# Patient Record
Sex: Male | Born: 1980 | Race: White | Hispanic: No | Marital: Married | Smoking: Current every day smoker
Health system: Southern US, Community
[De-identification: ages and names within clinical notes are randomized; demographics above are authoritative.]

## PROBLEM LIST (undated history)

## (undated) DIAGNOSIS — M25569 Pain in unspecified knee: Secondary | ICD-10-CM

## (undated) DIAGNOSIS — F988 Other specified behavioral and emotional disorders with onset usually occurring in childhood and adolescence: Secondary | ICD-10-CM

## (undated) DIAGNOSIS — G8929 Other chronic pain: Secondary | ICD-10-CM

## (undated) DIAGNOSIS — F419 Anxiety disorder, unspecified: Secondary | ICD-10-CM

## (undated) HISTORY — DX: Other specified behavioral and emotional disorders with onset usually occurring in childhood and adolescence: F98.8

## (undated) HISTORY — PX: HERNIA REPAIR: SHX51

## (undated) HISTORY — DX: Anxiety disorder, unspecified: F41.9

## (undated) HISTORY — DX: Pain in unspecified knee: M25.569

## (undated) HISTORY — DX: Other chronic pain: G89.29

---
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
## 2004-03-16 ENCOUNTER — Ambulatory Visit: Payer: Self-pay | Admitting: Internal Medicine

## 2004-06-30 ENCOUNTER — Ambulatory Visit: Payer: Self-pay | Admitting: Internal Medicine

## 2004-10-26 ENCOUNTER — Ambulatory Visit: Payer: Self-pay | Admitting: Internal Medicine

## 2004-11-29 ENCOUNTER — Ambulatory Visit: Payer: Self-pay | Admitting: Internal Medicine

## 2005-04-04 ENCOUNTER — Ambulatory Visit: Payer: Self-pay | Admitting: Internal Medicine

## 2005-04-13 ENCOUNTER — Ambulatory Visit: Payer: Self-pay | Admitting: Internal Medicine

## 2005-07-24 ENCOUNTER — Ambulatory Visit: Payer: Self-pay | Admitting: Internal Medicine

## 2005-08-08 ENCOUNTER — Ambulatory Visit: Payer: Self-pay | Admitting: Internal Medicine

## 2005-08-09 ENCOUNTER — Ambulatory Visit: Payer: Self-pay | Admitting: *Deleted

## 2005-08-21 ENCOUNTER — Ambulatory Visit: Payer: Self-pay | Admitting: Gastroenterology

## 2005-08-25 ENCOUNTER — Ambulatory Visit: Payer: Self-pay | Admitting: Gastroenterology

## 2005-08-25 ENCOUNTER — Encounter (INDEPENDENT_AMBULATORY_CARE_PROVIDER_SITE_OTHER): Payer: Self-pay | Admitting: Specialist

## 2005-09-20 ENCOUNTER — Ambulatory Visit: Payer: Self-pay | Admitting: Internal Medicine

## 2005-12-11 ENCOUNTER — Ambulatory Visit: Payer: Self-pay | Admitting: Gastroenterology

## 2006-01-16 ENCOUNTER — Ambulatory Visit: Payer: Self-pay | Admitting: Internal Medicine

## 2006-08-08 ENCOUNTER — Ambulatory Visit: Payer: Self-pay | Admitting: Internal Medicine

## 2006-08-08 ENCOUNTER — Ambulatory Visit (HOSPITAL_COMMUNITY): Admission: RE | Admit: 2006-08-08 | Discharge: 2006-08-08 | Payer: Self-pay | Admitting: Internal Medicine

## 2006-09-18 ENCOUNTER — Ambulatory Visit: Payer: Self-pay | Admitting: Internal Medicine

## 2006-11-15 ENCOUNTER — Ambulatory Visit: Payer: Self-pay | Admitting: Internal Medicine

## 2006-11-17 DIAGNOSIS — F988 Other specified behavioral and emotional disorders with onset usually occurring in childhood and adolescence: Secondary | ICD-10-CM | POA: Insufficient documentation

## 2007-01-17 DIAGNOSIS — F911 Conduct disorder, childhood-onset type: Secondary | ICD-10-CM | POA: Insufficient documentation

## 2007-02-05 ENCOUNTER — Ambulatory Visit: Payer: Self-pay | Admitting: Internal Medicine

## 2007-02-05 ENCOUNTER — Encounter: Payer: Self-pay | Admitting: Internal Medicine

## 2007-02-05 DIAGNOSIS — F411 Generalized anxiety disorder: Secondary | ICD-10-CM | POA: Insufficient documentation

## 2007-02-05 LAB — CONVERTED CEMR LAB
Bacteria, UA: NEGATIVE
Bilirubin Urine: NEGATIVE
Crystals: NEGATIVE
Hemoglobin, Urine: NEGATIVE
Ketones, ur: NEGATIVE mg/dL
Leukocytes, UA: NEGATIVE
Mucus, UA: NEGATIVE
Nitrite: NEGATIVE
RBC / HPF: NONE SEEN
Specific Gravity, Urine: 1.01 (ref 1.000–1.03)
Total Protein, Urine: NEGATIVE mg/dL
Urine Glucose: NEGATIVE mg/dL
Urobilinogen, UA: 0.2 (ref 0.0–1.0)
WBC, UA: NONE SEEN cells/hpf
pH: 6 (ref 5.0–8.0)

## 2007-02-27 ENCOUNTER — Telehealth: Payer: Self-pay | Admitting: Internal Medicine

## 2007-05-08 ENCOUNTER — Ambulatory Visit: Payer: Self-pay | Admitting: Internal Medicine

## 2007-05-08 DIAGNOSIS — K5289 Other specified noninfective gastroenteritis and colitis: Secondary | ICD-10-CM | POA: Insufficient documentation

## 2007-05-10 ENCOUNTER — Telehealth: Payer: Self-pay | Admitting: Internal Medicine

## 2007-08-01 ENCOUNTER — Ambulatory Visit: Payer: Self-pay | Admitting: Internal Medicine

## 2007-08-01 DIAGNOSIS — M25569 Pain in unspecified knee: Secondary | ICD-10-CM

## 2007-08-13 ENCOUNTER — Encounter: Payer: Self-pay | Admitting: Internal Medicine

## 2008-04-28 ENCOUNTER — Ambulatory Visit: Payer: Self-pay | Admitting: Internal Medicine

## 2008-06-10 ENCOUNTER — Ambulatory Visit: Payer: Self-pay | Admitting: Endocrinology

## 2008-06-10 DIAGNOSIS — R42 Dizziness and giddiness: Secondary | ICD-10-CM

## 2008-06-10 LAB — CONVERTED CEMR LAB
BUN: 12 mg/dL (ref 6–23)
Basophils Absolute: 0 10*3/uL (ref 0.0–0.1)
Basophils Relative: 0.4 % (ref 0.0–3.0)
CO2: 29 meq/L (ref 19–32)
Calcium: 9.1 mg/dL (ref 8.4–10.5)
Chloride: 105 meq/L (ref 96–112)
Creatinine, Ser: 1 mg/dL (ref 0.4–1.5)
Eosinophils Absolute: 0.2 10*3/uL (ref 0.0–0.7)
Eosinophils Relative: 3.1 % (ref 0.0–5.0)
GFR calc Af Amer: 115 mL/min
GFR calc non Af Amer: 95 mL/min
Glucose, Bld: 96 mg/dL (ref 70–99)
HCT: 43.6 % (ref 39.0–52.0)
Hemoglobin: 15.1 g/dL (ref 13.0–17.0)
Lymphocytes Relative: 27 % (ref 12.0–46.0)
MCHC: 34.6 g/dL (ref 30.0–36.0)
MCV: 92.9 fL (ref 78.0–100.0)
Monocytes Absolute: 0.7 10*3/uL (ref 0.1–1.0)
Monocytes Relative: 9.7 % (ref 3.0–12.0)
Neutro Abs: 4.1 10*3/uL (ref 1.4–7.7)
Neutrophils Relative %: 59.8 % (ref 43.0–77.0)
Platelets: 223 10*3/uL (ref 150–400)
Potassium: 4.4 meq/L (ref 3.5–5.1)
RBC: 4.69 M/uL (ref 4.22–5.81)
RDW: 11.8 % (ref 11.5–14.6)
Sodium: 141 meq/L (ref 135–145)
TSH: 0.52 microintl units/mL (ref 0.35–5.50)
WBC: 6.9 10*3/uL (ref 4.5–10.5)

## 2008-06-11 ENCOUNTER — Encounter: Payer: Self-pay | Admitting: Endocrinology

## 2008-06-11 ENCOUNTER — Ambulatory Visit: Payer: Self-pay

## 2008-06-15 ENCOUNTER — Ambulatory Visit: Payer: Self-pay | Admitting: Internal Medicine

## 2008-06-15 DIAGNOSIS — H6690 Otitis media, unspecified, unspecified ear: Secondary | ICD-10-CM | POA: Insufficient documentation

## 2008-06-15 DIAGNOSIS — J019 Acute sinusitis, unspecified: Secondary | ICD-10-CM | POA: Insufficient documentation

## 2008-09-11 ENCOUNTER — Telehealth (INDEPENDENT_AMBULATORY_CARE_PROVIDER_SITE_OTHER): Payer: Self-pay | Admitting: *Deleted

## 2009-07-12 ENCOUNTER — Ambulatory Visit: Payer: Self-pay | Admitting: Internal Medicine

## 2009-11-26 ENCOUNTER — Ambulatory Visit: Payer: Self-pay | Admitting: Internal Medicine

## 2009-11-26 DIAGNOSIS — F17200 Nicotine dependence, unspecified, uncomplicated: Secondary | ICD-10-CM

## 2009-11-30 ENCOUNTER — Encounter: Payer: Self-pay | Admitting: Internal Medicine

## 2010-05-24 NOTE — Letter (Signed)
Summary: [facility]  [facility]   Imported By: Lennie Odor 02/16/2010 11:43:41  _____________________________________________________________________  External Attachment:    Type:   Image     Comment:   External Document

## 2010-05-24 NOTE — Assessment & Plan Note (Signed)
Summary: KNEE PAIN/NWS  #   Vital Signs:  Patient profile:   30 year old male Height:      76 inches Weight:      182.25 pounds BMI:     22.26 O2 Sat:      97 % on Room air Temp:     98.6 degrees F oral Pulse rate:   75 / minute BP sitting:   100 / 58  (left arm) Cuff size:   regular  Vitals Entered By: Zella Ball Ewing CMA (AAMA) (November 26, 2009 3:06 PM)  O2 Flow:  Room air  CC: Knee problem, referral for surgery/RE   CC:  Knee problem and referral for surgery/RE.  History of Present Illness: here with ongoing left > right kneepain, bilat swelling, worse to just walking , works for Medical [employer]; no falls or twisting or recnet injury lately.  Pt denies CP, sob, doe, wheezing, orthopnea, pnd, worsening LE edema, palps, dizziness or syncope   Pt denies new neuro symptoms such as headache, facial or extremity weakness Knee pain worse to crawl and has to do this for work on occasion.    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 1 By Mouth Two Times A Day As Needed Pain 2)  Vitamin D3 1000 Unit  Tabs (Cholecalciferol) .Marland Kitchen.. 1 By Mouth Daily 3)  B Complex  Tabs (B Complex Vitamins) .Marland Kitchen.. 1 By Mouth Qd 4)  Promethazine Hcl 25 Mg Tabs (Promethazine Hcl) .Marland Kitchen.. 1 By Mouth Q 6 Hrs As Needed Nausea  Current Medications (verified): 1)  Vicodin 5-500 Mg  Tabs (Hydrocodone-Acetaminophen) .Marland Kitchen.. 1 By Mouth Two Times A Day As Needed Pain 2)  Vitamin D3 1000 Unit  Tabs (Cholecalciferol) .Marland Kitchen.. 1 By Mouth Daily 3)  B Complex  Tabs (B Complex Vitamins) .Marland Kitchen.. 1 By Mouth Qd 4)  Ritalin 10 Mg Tabs (Methylphenidate Hcl) .Marland Kitchen.. 1po Qam  - To Fill Jan 25, 2010 5)  Chantix Continuing  Month Pak 1 Mg Tabs (Varenicline Tartrate) .Marland Kitchen.. 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  Risk Factors: Exercise: yes (11/17/2006)  Risk Factors: Smoking Status: current (11/17/2006) Packs/Day: 1 pk pd (11/17/2006)  Review of Systems  The patient denies anorexia, fever, weight loss, weight gain, vision loss, decreased hearing, hoarseness, chest pain, syncope, dyspnea on exertion, peripheral edema, prolonged cough, headaches, hemoptysis, abdominal pain, melena, hematochezia, severe indigestion/heartburn, hematuria, muscle weakness, suspicious skin lesions, transient blindness, difficulty walking, depression, unusual weight change, abnormal bleeding, enlarged lymph nodes, and angioedema.         all otherwise negative per pt -  except requests to re-start the ritalin since it helped in the past  Impression & Recommendations:  Problem # 1:  Preventive Health Care (ICD-V70.0) Overall doing well, age appropriate education and counseling updated and referral for appropriate preventive services done unless declined, immunizations up to date or declined, diet counseling done if overweight, urged to quit smoking if smokes , most recent labs reviewed and current ordered if appropriate, ecg reviewed or declined (interpretation per ECG scanned in the EMR if done); information regarding Medicare Prevention requirements given if appropriate; speciality  referrals updated as appropriate   Problem # 2:  KNEE PAIN, BILATERAL (ICD-719.46)  His updated medication list for this problem includes:    Vicodin 5-500 Mg Tabs (Hydrocodone-acetaminophen) .Marland Kitchen... 1 by mouth two times a day as needed pain for ortho referral, pain med refill  Orders: Orthopedic Surgeon Referral (Ortho Surgeon)  Problem # 3:  ADD (ICD-314.00) treat as above, f/u any worsening signs or symptoms   Problem # 4:  SMOKER (ICD-305.1)  His updated medication list for this problem includes:    Chantix Continuing Month Pak 1 Mg Tabs (Varenicline tartrate) .Marland Kitchen... 1 by mouth two times a day treat as above, f/u any worsening signs or symptoms   Complete Medication List: 1)  Vicodin 5-500 Mg Tabs (Hydrocodone-acetaminophen) .Marland Kitchen.. 1 by mouth two times a day as needed pain 2)  Vitamin D3 1000 Unit Tabs (Cholecalciferol) .Marland Kitchen.. 1 by mouth daily 3)  B Complex Tabs (B complex vitamins) .Marland Kitchen.. 1 by mouth qd 4)  Ritalin 10 Mg Tabs (Methylphenidate hcl) .Marland Kitchen.. 1po qam  - to fill Jan 25, 2010 5)  Chantix Continuing Month Pak 1 Mg Tabs (Varenicline tartrate) .Marland Kitchen.. 1 by mouth two times a day  Patient Instructions: 1)  you had the tetanus shot today 2)  Please take all new medications as prescribed  - the chantix 3)  You will be contacted about the referral(s) to: [place] [facility] 4)  Continue all previous medications as before this visit - the ritalin, and pain medicine 5)  Please return one day soon at your convenience for the blood work - CPX labs 6)  Please call the number on the [facility] for results of your testing  7)  Please schedule a follow-up appointment in 1 year or sooner if needed Prescriptions: CHANTIX CONTINUING MONTH PAK 1 MG TABS (VARENICLINE TARTRATE) 1 by mouth two times a day  #1 x 1   Entered and Authorized by:   Corwin Levins MD   Signed by:   Corwin Levins MD on 11/26/2009   Method used:   Print then Give to Patient   RxID:   [id number] CHANTIX STARTING MONTH PAK 0.5  MG X 11 & 1 MG X 42 TABS (VARENICLINE TARTRATE) 1 by mouth two times a day  #1pk x 0   Entered and Authorized by:   Corwin Levins MD   Signed by:   Corwin Levins MD on 11/26/2009   Method used:   Print then Give to Patient   RxID:   [id number] RITALIN 10 MG TABS (METHYLPHENIDATE HCL) 1po qam  - to fill Jan 25, 2010  #30 x 0   Entered and Authorized by:   Corwin Levins MD   Signed by:   Corwin Levins MD on 11/26/2009   Method used:   Print then Give to Patient   RxID:   [id number] RITALIN 10 MG TABS (METHYLPHENIDATE HCL) 1po qam  - to fill sept 4, 2011  #30 x 0   Entered and Authorized by:  Corwin Levins MD   Signed by:   Corwin Levins MD on 11/26/2009   Method used:   Print then Give to Patient   RxID:   [id number] RITALIN 10 MG TABS (METHYLPHENIDATE HCL) 1po qam  - to fill Nov 26, 2009  #30 x 0   Entered and Authorized by:   Corwin Levins MD   Signed by:   Corwin Levins MD on 11/26/2009   Method used:   Print then Give to Patient   RxID:   [id number] VICODIN 5-500 MG  TABS (HYDROCODONE-ACETAMINOPHEN) 1 by mouth two times a day as needed pain  #60 x 0   Entered and Authorized by:   Corwin Levins MD   Signed by:   Corwin Levins MD on 11/26/2009   Method used:   Print then Give to Patient   RxID:   [id number]   Appended Document: Immunization Entry      Immunizations Administered:  Tetanus Vaccine:    Vaccine Type: Tdap    Site: left deltoid    Mfr: GlaxoSmithKline    Dose: 0.5 ml    Route: IM    Given by: Zella Ball Ewing CMA (AAMA)    Exp. Date: 10/22/2011    Lot #: [id number]    VIS given: 03/12/07 version given November 26, 2009.

## 2010-05-24 NOTE — Letter (Signed)
Summary: Out of Work  [facility]  [address] [place], [place] [address]   Phone: [phone]  Fax: [phone]    July 12, 2009   Employee:  Ishmael A Vanhorne    To Whom It May Concern:   For Medical reasons, please excuse the above named employee from work for the following dates:  Start:   Jul 12, 2009  End:   Jul 13, 2009    -   to return to work Jul 14, 2009  If you need additional information, please feel free to contact our office.         Sincerely,    Corwin Levins MD

## 2010-05-24 NOTE — Assessment & Plan Note (Signed)
Summary: headache/not feeling well-lb   Vital Signs:  Patient profile:   30 year old male Height:      75 inches Weight:      179 pounds BMI:     22.45 O2 Sat:      97 % on Room air Temp:     97.5 degrees F oral Pulse rate:   61 / minute BP sitting:   100 / 60  (left arm) Cuff size:   regular  Vitals Entered ByZella Ball Ewing (July 12, 2009 2:43 PM)  O2 Flow:  Room air CC: nauseated/RE   CC:  nauseated/RE.  History of Present Illness: here iwth acute onset yest afternoon late with n/v ; several episodes, still vomit this am;  no fever, headache;  does have abd pain crampy gen;d but mostly lower part;  everything solid jsut comes up but liquids stay down adn drinking water this am ok;  no Pt denies CP, sob, doe, wheezing, orthopnea, pnd, worsening LE edema, palps, dizziness or syncope  no change in bowels so far, no vomiting or other blood. No joint pains or rash.  No one else ill at home or work,  was visiting neice yesterday (29yo) who also started vomiting this AM.  No chills or myalgias., but has had some head congestion.   Overall functioning well at work without concentration issues, good work performaince, and no increased depressive or anxiety or panic - has been taking himself off his meds to see how he would do.  No work or social problems per pt  Preventive Screening-Counseling & Management      Drug Use:  no.    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 1 Po Two Times A Day 2)  Vicodin 5-500 Mg  Tabs (Hydrocodone-Acetaminophen) .Marland Kitchen.. 1 By Mouth Two Times A Day As Needed Pain 3)  Ritalin 10 Mg  Tabs (Methylphenidate Hcl) .Marland Kitchen.. 1po Once Daily - To Fill Jun 22, 2008 4)  Vitamin D3 1000 Unit  Tabs (Cholecalciferol) .Marland Kitchen.. 1 By Mouth Daily 5)  B Complex  Tabs (B Complex Vitamins) .Marland Kitchen.. 1 By Mouth Qd 6)  Omnaris 50 Mcg/act Susp (Ciclesonide) .Marland Kitchen.. 1 Spr Each Nostr Qd  Current Medications (verified): 1)  Vicodin 5-500 Mg  Tabs (Hydrocodone-Acetaminophen) .Marland Kitchen.. 1 By Mouth Two Times A Day As Needed Pain 2)  Vitamin D3 1000 Unit  Tabs (Cholecalciferol) .Marland Kitchen.. 1 By Mouth Daily 3)  B Complex  Tabs (B Complex Vitamins) .Marland Kitchen.. 1 By Mouth Qd 4)  Promethazine Hcl 25 Mg Tabs (Promethazine Hcl) .Marland Kitchen.. 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   Eyes:  vision grossly intact, pupils equal, and pupils round.   Ears:  bilat tm's mild erythema , sinus nontender Nose:  nasal dischargemucosal pallor and mucosal erythema.   Mouth:  pharyngeal erythema and fair dentition.   Neck:  supple and no masses.   Lungs:  normal respiratory effort and normal breath sounds.   Heart:  normal rate and regular rhythm.   Abdomen:  soft and normal bowel sounds.  with diffuse mild tender without  distension, no guarding or rebound Extremities:  no edema, no erythema  Skin:  color normal and no rashes.   Psych:  not anxious appearing and not depressed appearing.     Impression & Recommendations:  Problem # 1:  VIRAL INFECTION-UNSPEC (ICD-079.99) exam c/w above, doubt IBD or obstruciton or pancreatitis;  ok for phenergan as needed, increased fluids, note for work, f/u any worsening symptoms  Problem # 2:  ADD (ICD-314.00) ok to stay off the ritalin  Problem # 3:  ANXIETY (ICD-300.00)  The following medications were removed from the medication list:    Klonopin 1 Mg Tabs (Clonazepam) .Marland Kitchen... 1 po two times a day stable overall by hx and exam, ok to stay off his meds   Complete Medication List: 1)  Vicodin 5-500 Mg Tabs (Hydrocodone-acetaminophen) .Marland Kitchen.. 1 by mouth two times a day as needed pain 2)  Vitamin D3 1000 Unit Tabs (Cholecalciferol) .Marland Kitchen.. 1 by mouth daily 3)  B Complex Tabs (B complex vitamins) .Marland Kitchen.. 1 by mouth qd 4)  Promethazine Hcl 25 Mg Tabs (Promethazine hcl) .Marland Kitchen.. 1 by mouth q 6 hrs as needed nausea  Patient Instructions: 1)  Please take all new medications as prescribed 2)  Continue all previous medications as before this visit  3)  please drink plenty of fluids 4)  you are given the work note 5)  Please schedule a follow-up appointment as needed. Prescriptions: PROMETHAZINE HCL 25 MG TABS (PROMETHAZINE HCL) 1 by mouth q 6 hrs as needed nausea  #40 x 1   Entered and Authorized by:   Corwin Levins MD   Signed by:   Corwin Levins MD on 07/12/2009   Method used:   Print then Give to Patient   RxID:   [id number]

## 2010-09-09 NOTE — Assessment & Plan Note (Signed)
[facility]                           GASTROENTEROLOGY OFFICE NOTE   NAME:[name], Darrell A                       MRN:          [id number]  DATE:12/11/2005                            DOB:          Apr 28, 1980    PRIMARY CARE PHYSICIAN:  Sonda Primes, MD   PROBLEMS:  1. Functional diarrhea status post colonoscopy May 2007, normal; random      biopsies normal.  2. Functional dyspepsia.  CT scan negative. CBC, C-met negative.  EGD at      time of colonoscopy essentially normal.   INTERVAL HISTORY:  I last saw Darrell [name] at the time of his upper and lower  endoscopy in early May.  He cancelled one appointment since then but shows  up for follow up now.  Basically says his symptoms are much better.  He  rarely has been bothered by diarrhea or abdominal discomfort.  He had one  question about his symptoms, wondering whether they could be stress related.  He also stopped drinking Red Bull.  He was drinking 3-4 Red Bull's on a  daily basis.   CURRENT MEDICATIONS:  None.   PHYSICAL EXAMINATION:  VITAL SIGNS:  Weight 171 pounds, unchanged since  first visit, blood pressure 110/78, pulse 72.  ABDOMEN:  Soft, nontender, nondistended.  Normal bowel sounds.   ASSESSMENT/PLAN:  A 30 year old man with likely functional abdominal  symptoms.  Very thorough workup, all negative including CBC, labs,  colonoscopy, upper endoscopy.  He brought the idea that possibly stress is  causing some of the symptoms, and I explained that absolutely stress could  be playing a role.  He started a new job shortly before his symptoms started  with much more responsibilities, and he does feel like he has been quite  anxious about it.  He also was drinking a lot of Red Bull and stopped that,  so probably a combination of those were playing a role in his symptoms.  I  see no reason for any further workup, and he will get in touch with me if he  has any further issues.                        Rachael Fee, MD   DPJ/MedQ  DD:  12/11/2005  DT:  12/12/2005  Job #:  [id number]   cc:   Sonda Primes, MD

## 2010-09-09 NOTE — Assessment & Plan Note (Signed)
[facility]                           PRIMARY CARE OFFICE NOTE   NAME:Darrell [name], Darrell [name]                       MRN:          [id number]  DATE:08/08/2006                            DOB:          1980-05-25    PROCEDURE:  Left thumb subungual hematoma evacuation.   INDICATIONS FOR PROCEDURE:  Subungual hematoma, injured  thumb on Friday  at work.  The risks and benefits were explained to the patient in detail  and he agreed to proceed.   SURGEON:  Georgina Quint. Plotnikov, M.D.   DESCRIPTION OF PROCEDURE:  The nail was cleaned and the hole was burned  through with [name] cauter in the usual fashion.  Several drops of dark black  blood extracted.  The pressure was relieved.  The patient tolerated the  procedure well.  Complications:  None.   DISPOSITION:  The wound was dressed with antibiotic ointment, Telfa,  gauze and an Ace wrap.  The patient was sent for an x-ray.  [name] tetanus  shot was provided.  Tylenol for pain.     Georgina Quint. Plotnikov, MD  Electronically Signed    AVP/MedQ  DD: 08/08/2006  DT: 08/08/2006  Job #: [id number]

## 2011-05-29 ENCOUNTER — Encounter (HOSPITAL_COMMUNITY): Payer: Self-pay | Admitting: *Deleted

## 2011-05-29 ENCOUNTER — Emergency Department (HOSPITAL_COMMUNITY)
Admission: EM | Admit: 2011-05-29 | Discharge: 2011-05-29 | Disposition: A | Discharge status: Home / Self Care | Payer: Self-pay | Source: Home / Self Care | Attending: Emergency Medicine | Admitting: Emergency Medicine

## 2011-05-29 DIAGNOSIS — L089 Local infection of the skin and subcutaneous tissue, unspecified: Secondary | ICD-10-CM

## 2011-05-29 DIAGNOSIS — L723 Sebaceous cyst: Secondary | ICD-10-CM

## 2011-05-29 MED ORDER — TRAMADOL HCL 50 MG PO TABS: 100.0000 mg | ORAL_TABLET | Freq: Three times a day (TID) | ORAL | Status: AC | PRN

## 2011-05-29 MED ORDER — CEPHALEXIN 500 MG PO CAPS: 500.0000 mg | ORAL_CAPSULE | Freq: Three times a day (TID) | ORAL | Status: AC

## 2011-05-29 NOTE — Discharge Instructions (Signed)

## 2011-05-29 NOTE — ED Provider Notes (Signed)
Chief Complaint  Patient presents with  . Facial Swelling    History of Present Illness:  The patient has had recurring cysts in his ear lobe areas bilaterally for many years. He's never had to have these incised or drained in the past. Over the past 3 days he's had swelling in his right earlobe with tenderness. There's been no real drainage. No fever or chills.  Review of Systems:  Other than noted above, the patient denies any of the following symptoms: Systemic:  No fever, chills, sweats, weight loss, or fatigue. ENT:  No nasal congestion, rhinorrhea, sore throat, swelling of lips, tongue or throat. Resp:  No cough, wheezing, or shortness of breath. Skin:  No rash, itching, nodules, or suspicious lesions.  PMFSH:  Past medical history, family history, social history, meds, and allergies were reviewed.  Physical Exam:   Vital signs:  BP 117/69  Pulse 68  Temp(Src) 97.6 F (36.4 C) (Oral)  Resp 20  SpO2 98% Gen:  Alert, oriented, in no distress. Skin:  There is a cyst in the right earlobe right where it attaches to the cheek. This measures about a centimeter in diameter. It's slightly erythematous and tender to touch. It's not draining any pus. Just inferiorly to this there appears to be an 8mm lymph node. Otherwise ENT exam was normal with normal nasal mucosa and oral pharynx. There were no intraoral lesions. The TMs and canals are normal. There was no cervical lymphadenopathy other than as described above. No other masses were felt.  Assessment:   Diagnoses that have been ruled out:  None  Diagnoses that are still under consideration:  None  Final diagnoses:  Infected sebaceous cyst of skin    Plan:   1.  The following meds were prescribed:   New Prescriptions   CEPHALEXIN (KEFLEX) 500 MG CAPSULE    Take 1 capsule (500 mg total) by mouth 3 (three) times daily.   TRAMADOL (ULTRAM) 50 MG TABLET    Take 2 tablets (100 mg total) by mouth every 8 (eight) hours as needed for pain.     2.  The patient was instructed in symptomatic care and handouts were given. 3.  The patient was told to return if becoming worse in any way, if no better in 3 or 4 days, and given some red flag symptoms that would indicate earlier return.  Follow up:  The patient was told To followup With Dr. Suszanne Conners tomorrow. I attempted to call Dr. Suszanne Conners, but he never did return my call.   Roque Lias, MD 05/29/11 2105

## 2011-05-29 NOTE — ED Notes (Signed)
Noticed swelling and tenderness to distal right ear pinna starting 2 days ago with progressive worsening.  Has tried squeezing area without any results.  Denies fevers.

## 2013-02-28 ENCOUNTER — Ambulatory Visit (HOSPITAL_COMMUNITY)
Admission: RE | Admit: 2013-02-28 | Discharge: 2013-02-28 | Disposition: A | Discharge status: Home / Self Care | Payer: 59 | Source: Ambulatory Visit | Attending: Physician Assistant | Admitting: Physician Assistant

## 2013-02-28 ENCOUNTER — Other Ambulatory Visit (HOSPITAL_COMMUNITY): Payer: Self-pay | Admitting: Physician Assistant

## 2013-02-28 DIAGNOSIS — M25562 Pain in left knee: Secondary | ICD-10-CM

## 2013-02-28 DIAGNOSIS — M25569 Pain in unspecified knee: Secondary | ICD-10-CM | POA: Insufficient documentation

## 2013-02-28 DIAGNOSIS — M25561 Pain in right knee: Secondary | ICD-10-CM

## 2014-04-19 ENCOUNTER — Emergency Department (HOSPITAL_COMMUNITY)
Admission: EM | Admit: 2014-04-19 | Discharge: 2014-04-19 | Disposition: A | Discharge status: Home / Self Care | Payer: 59 | Attending: Emergency Medicine | Admitting: Emergency Medicine

## 2014-04-19 ENCOUNTER — Encounter (HOSPITAL_COMMUNITY): Payer: Self-pay | Admitting: Emergency Medicine

## 2014-04-19 DIAGNOSIS — S3992XA Unspecified injury of lower back, initial encounter: Secondary | ICD-10-CM | POA: Diagnosis present

## 2014-04-19 DIAGNOSIS — S39012A Strain of muscle, fascia and tendon of lower back, initial encounter: Secondary | ICD-10-CM

## 2014-04-19 DIAGNOSIS — Y998 Other external cause status: Secondary | ICD-10-CM | POA: Insufficient documentation

## 2014-04-19 DIAGNOSIS — Z720 Tobacco use: Secondary | ICD-10-CM | POA: Diagnosis not present

## 2014-04-19 DIAGNOSIS — Y9389 Activity, other specified: Secondary | ICD-10-CM | POA: Insufficient documentation

## 2014-04-19 DIAGNOSIS — X58XXXA Exposure to other specified factors, initial encounter: Secondary | ICD-10-CM | POA: Insufficient documentation

## 2014-04-19 DIAGNOSIS — G8929 Other chronic pain: Secondary | ICD-10-CM | POA: Diagnosis not present

## 2014-04-19 DIAGNOSIS — Y9289 Other specified places as the place of occurrence of the external cause: Secondary | ICD-10-CM | POA: Diagnosis not present

## 2014-04-19 DIAGNOSIS — F909 Attention-deficit hyperactivity disorder, unspecified type: Secondary | ICD-10-CM | POA: Diagnosis not present

## 2014-04-19 MED ORDER — KETOROLAC TROMETHAMINE 10 MG PO TABS
10.0000 mg | ORAL_TABLET | Freq: Once | ORAL | Status: AC
  Administered 2014-04-19: 10 mg via ORAL
  Filled 2014-04-19: qty 1

## 2014-04-19 MED ORDER — DIAZEPAM 5 MG PO TABS
5.0000 mg | ORAL_TABLET | Freq: Once | ORAL | Status: AC
  Administered 2014-04-19: 5 mg via ORAL
  Filled 2014-04-19: qty 1

## 2014-04-19 MED ORDER — METHOCARBAMOL 500 MG PO TABS: 500.0000 mg | ORAL_TABLET | Freq: Three times a day (TID) | ORAL | Status: AC

## 2014-04-19 MED ORDER — HYDROCODONE-ACETAMINOPHEN 5-325 MG PO TABS
2.0000 | ORAL_TABLET | Freq: Once | ORAL | Status: AC
  Administered 2014-04-19: 2 via ORAL
  Filled 2014-04-19: qty 2

## 2014-04-19 MED ORDER — MELOXICAM 15 MG PO TABS: 15.0000 mg | ORAL_TABLET | Freq: Every day | ORAL | Status: AC

## 2014-04-19 MED ORDER — HYDROCODONE-ACETAMINOPHEN 5-325 MG PO TABS: 1.0000 | ORAL_TABLET | ORAL | Status: AC | PRN

## 2014-04-19 NOTE — Discharge Instructions (Signed)
Heating pad to the lower back may be helpful. Please rest the back as much as possible. Lumbosacral Strain Lumbosacral strain is a strain of any of the parts that make up your lumbosacral vertebrae. Your lumbosacral vertebrae are the bones that make up the lower third of your backbone. Your lumbosacral vertebrae are held together by muscles and tough, fibrous tissue (ligaments).  CAUSES  A sudden blow to your back can cause lumbosacral strain. Also, anything that causes an excessive stretch of the muscles in the low back can cause this strain. This is typically seen when people exert themselves strenuously, fall, lift heavy objects, bend, or crouch repeatedly. RISK FACTORS  Physically demanding work.  Participation in pushing or pulling sports or sports that require a sudden twist of the back (tennis, golf, baseball).  Weight lifting.  Excessive lower back curvature.  Forward-tilted pelvis.  Weak back or abdominal muscles or both.  Tight hamstrings. SIGNS AND SYMPTOMS  Lumbosacral strain may cause pain in the area of your injury or pain that moves (radiates) down your leg.  DIAGNOSIS Your health care provider can often diagnose lumbosacral strain through a physical exam. In some cases, you may need tests such as X-ray exams.  TREATMENT  Treatment for your lower back injury depends on many factors that your clinician will have to evaluate. However, most treatment will include the use of anti-inflammatory medicines. HOME CARE INSTRUCTIONS   Avoid hard physical activities (tennis, racquetball, waterskiing) if you are not in proper physical condition for it. This may aggravate or create problems.  If you have a back problem, avoid sports requiring sudden body movements. Swimming and walking are generally safer activities.  Maintain good posture.  Maintain a healthy weight.  For acute conditions, you may put ice on the injured area.  Put ice in a plastic bag.  Place a towel between  your skin and the bag.  Leave the ice on for 20 minutes, 2-3 times a day.  When the low back starts healing, stretching and strengthening exercises may be recommended. SEEK MEDICAL CARE IF:  Your back pain is getting worse.  You experience severe back pain not relieved with medicines. SEEK IMMEDIATE MEDICAL CARE IF:   You have numbness, tingling, weakness, or problems with the use of your arms or legs.  There is a change in bowel or bladder control.  You have increasing pain in any area of the body, including your belly (abdomen).  You notice shortness of breath, dizziness, or feel faint.  You feel sick to your stomach (nauseous), are throwing up (vomiting), or become sweaty.  You notice discoloration of your toes or legs, or your feet get very cold. MAKE SURE YOU:   Understand these instructions.  Will watch your condition.  Will get help right away if you are not doing well or get worse. Document Released: 01/18/2005 Document Revised: 04/15/2013 Document Reviewed: 11/27/2012 [facility] Patient Information 2015 [employer], [place]. This information is not intended to replace advice given to you by your health care provider. Make sure you discuss any questions you have with your health care provider.

## 2014-04-19 NOTE — ED Notes (Signed)
PT c/o lower back pain that started this evening while carrying a heavy object and stated sudden sharp back that took him down on his knees from pain.

## 2014-04-19 NOTE — ED Provider Notes (Signed)
CSN: [id number]     Arrival date & time 04/19/14  1557 History  This chart was scribed for Darrell QualeHobson Kindrick Lankford, PA-C, working with Geoffery Lyonsouglas Delo, MD by Chestine SporeSoijett Blue, ED Scribe. The patient was seen in room APFT23/APFT23 at 5:55 PM.    Chief Complaint  Patient presents with  . Back Pain     Patient is a 33 y.o. male presenting with back pain. The history is provided by the patient. No language interpreter was used.  Back Pain Location:  Lumbar spine Quality:  Burning Radiates to:  Does not radiate Pain severity:  Moderate Onset quality:  Sudden Timing:  Constant Progression:  Unchanged Chronicity:  New Context: lifting heavy objects   Associated symptoms: no bladder incontinence and no dysuria     HPI Comments: Darrell [name] is a 33 y.o. male who presents to the Emergency Department complaining of low back pain onset this evening PTA. He notes that he was carrying a heavy object and he had sudden sharp back pain. He reports that when the pain came he went down to his knees because of it. The pain is described as burning and warm. There was a spasm on his inner thigh, when the incident occurred. Relative reports that the pt picked up the heavy object, placed it down, and then repeated the steps. After the second time of doing this, that is when he could not get back up from the ground position. He then had to have help to get into the car. He denies bowel/bladder incontinence, loss of feeling, and any other associated symptoms. Denies issues with his back in the past. Denies surgery to his back in the past. He is taking Tramadol for the inflammation in his knee.    Past Medical History  Diagnosis Date  . ADD (attention deficit disorder)   . Anxiety   . Anxiety   . Chronic knee pain    Past Surgical History  Procedure Laterality Date  . Hernia repair     Family History  Problem Relation Age of Onset  . Colitis    . Colon cancer    . Alcohol abuse    . Heart disease     History   Substance Use Topics  . Smoking status: Current Every Day Smoker -- 1.00 packs/day    Types: Cigarettes  . Smokeless tobacco: Not on file  . Alcohol Use: No    Review of Systems  Gastrointestinal:       No bowel incontinence  Genitourinary: Negative for bladder incontinence, dysuria and frequency.       No bladder incontinence  Musculoskeletal: Positive for back pain.  All other systems reviewed and are negative.     Allergies  Review of patient's allergies indicates no known allergies.  Home Medications   Prior to Admission medications   Medication Sig Start Date End Date Taking? Authorizing Provider  b complex vitamins tablet Take 1 tablet by mouth daily.    Historical Provider, MD  Cholecalciferol (VITAMIN D-3) 1000 UNITS CAPS Take by mouth.    Historical Provider, MD  HYDROcodone-acetaminophen (VICODIN) 5-500 MG per tablet Take 1 tablet by mouth every 6 (six) hours as needed.    Historical Provider, MD  methylphenidate (RITALIN) 10 MG tablet Take 10 mg by mouth daily.    Historical Provider, MD  varenicline (CHANTIX PAK) 0.5 MG X 11 & 1 MG X 42 tablet Take by mouth 2 (two) times daily. Take one 0.5 mg tablet by mouth once daily for  3 days, then increase to one 0.5 mg tablet twice daily for 4 days, then increase to one 1 mg tablet twice daily.    Historical Provider, MD   BP 109/72 mmHg  Pulse 75  Temp(Src) 97.9 F (36.6 C) (Oral)  Resp 20  Ht 6\' 3"  (1.905 m)  Wt 175 lb (79.379 kg)  BMI 21.87 kg/m2  SpO2 100%  Physical Exam  Constitutional: He is oriented to person, place, and time. He appears well-developed and well-nourished. No distress.  HENT:  Head: Normocephalic and atraumatic.  Eyes: EOM are normal.  Neck: Neck supple. No tracheal deviation present.  Cardiovascular: Normal rate, regular rhythm and normal heart sounds.  Exam reveals no gallop and no friction rub.   No murmur heard. Pulmonary/Chest: Effort normal and breath sounds normal. No respiratory  distress. He has no wheezes. He has no rales.  Symmetrical rise and fall of the chest. Lungs clear anteriorly and posteriorly.   Musculoskeletal: Normal range of motion.       Lumbar back: He exhibits tenderness.  No scalpula dislocation. No palpable step off of the C-spine or T-spine. Paraspinal tenderness of the upper Lumbar. No step off of the L-spine. Paraspinal tenderness of the right lower lumbar.   Neurological: He is alert and oriented to person, place, and time. No sensory deficit. Gait normal.  No gross motor or sensory deficits. Gait is steady with no foot drop.  Skin: Skin is warm and dry.  Psychiatric: He has a normal mood and affect. His behavior is normal.  Nursing note and vitals reviewed.   ED Course  Procedures (including critical care time) DIAGNOSTIC STUDIES: Oxygen Saturation is 100% on room air, normal by my interpretation.    COORDINATION OF CARE: 6:02 PM-Discussed treatment plan which includes Valium, toradol, norco, heating pad, rest with pt at bedside and pt agreed to plan.   Labs Review Labs Reviewed - No data to display  Imaging Review No results found.   EKG Interpretation None      MDM  No gross neuro deficit noted. Suspect lumbar strain.  Pt encouraged to follow up with primary MD. Pt advised to rest back area as much as possible and use a heating pad if possible. Rx for norco, mobic and robaxin given to the patient.    Final diagnoses:  Lumbar strain, initial encounter    *I have reviewed nursing notes, vital signs, and all appropriate lab and imaging results for this patient.**  I personally performed the services described in this documentation, which was scribed in my presence. The recorded information has been reviewed and is accurate.    Kathie DikeHobson M Anntionette Madkins, PA-C 04/24/14 1348  Geoffery Lyonsouglas Delo, MD 04/26/14 [phone]

## 2014-07-26 IMAGING — CR DG KNEE 1-2V*R*
2 series · 2 of 2 positions shown · non-contrast
Comparison: None.

CLINICAL DATA: Pain

EXAM:
RIGHT KNEE - 1-2 VIEW

[view not recorded (1 of 2)]
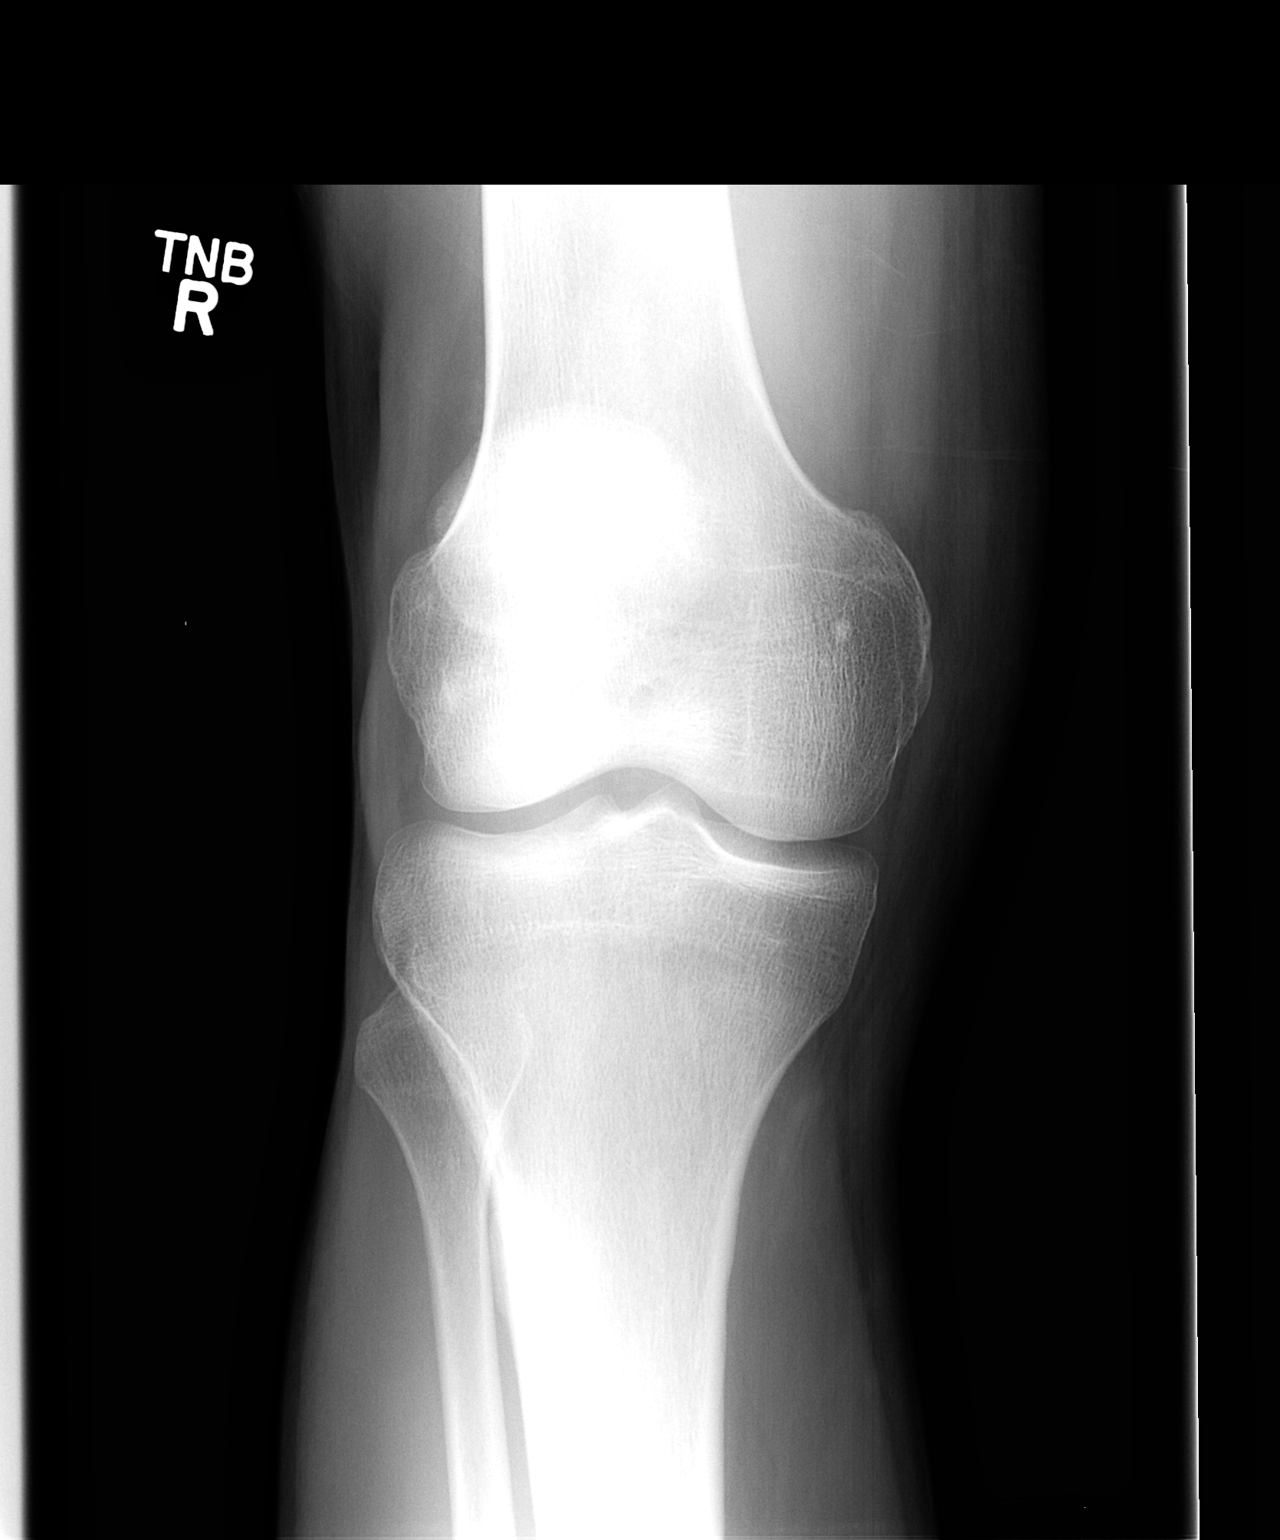

[view not recorded (2 of 2)]
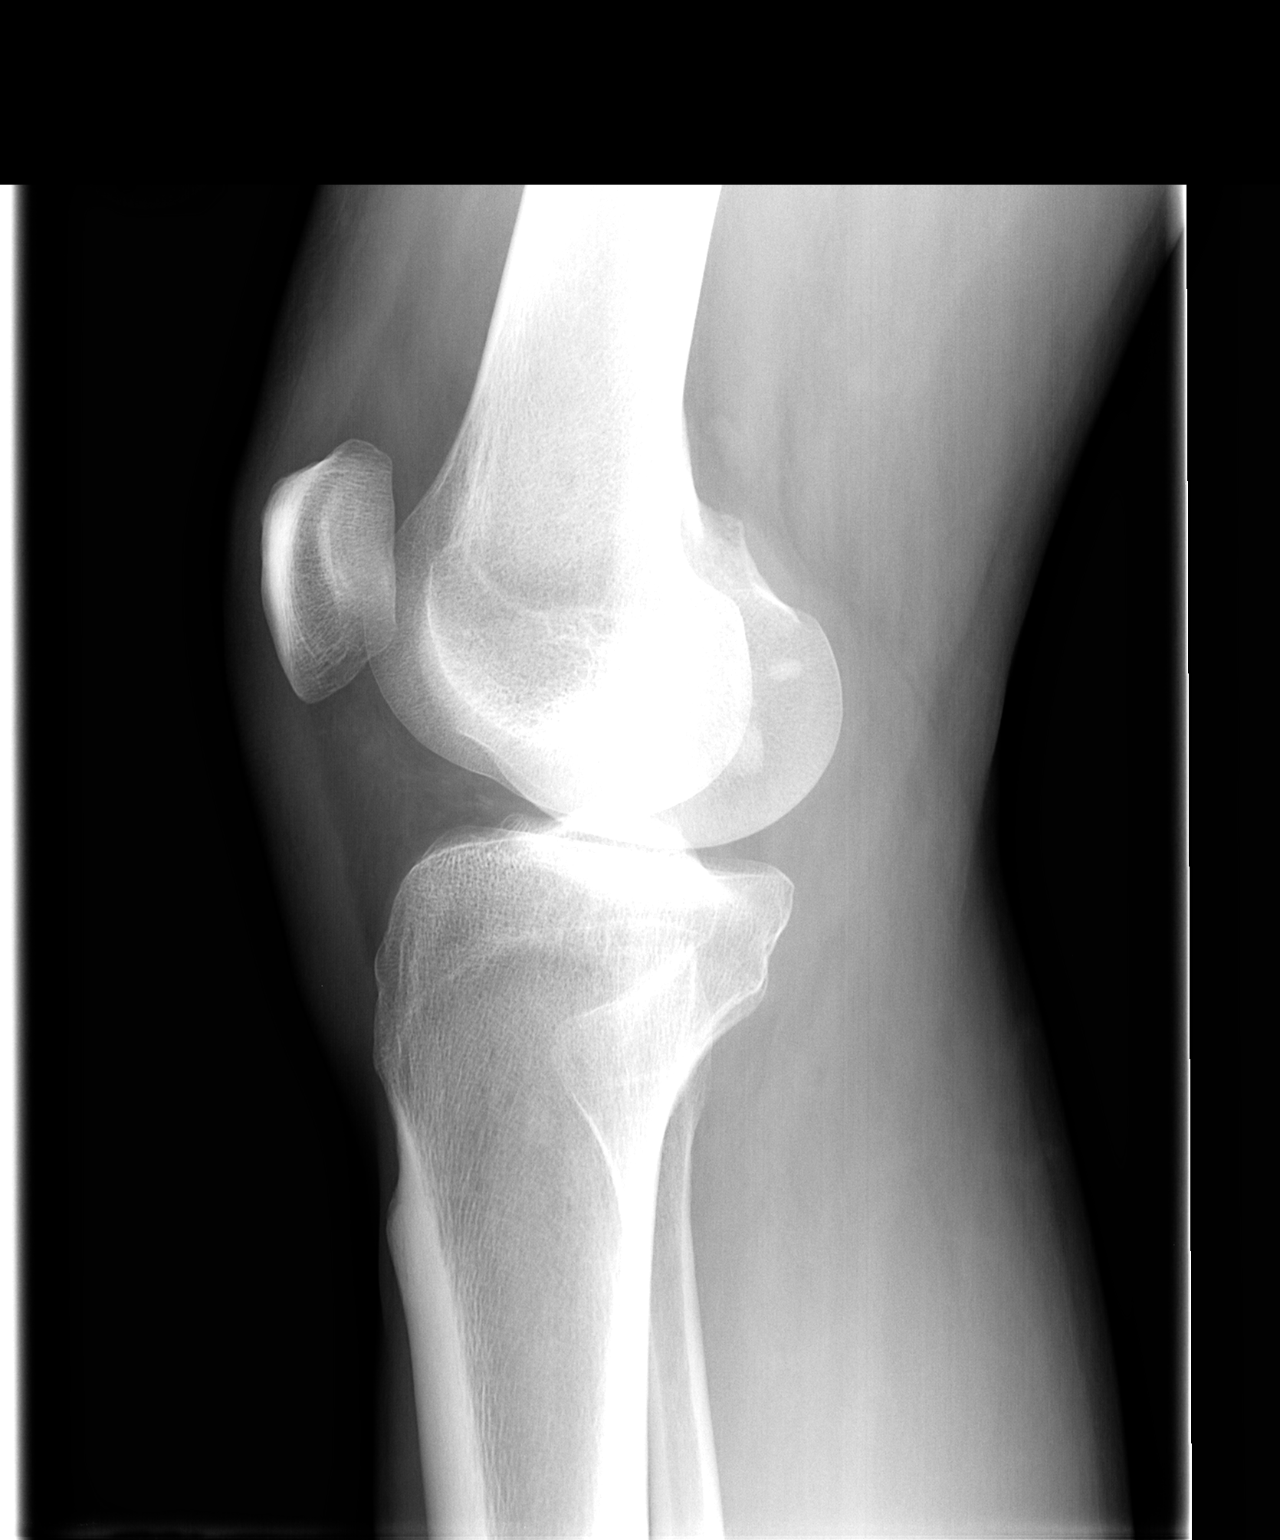

[2 of 2 positions shown; findings below may reference images not displayed]

FINDINGS: There is no evidence of fracture, dislocation, or joint effusion.
There is no evidence of arthropathy or other focal bone abnormality.
Soft tissues are unremarkable.
IMPRESSION: Negative.

## 2014-07-26 IMAGING — CR DG KNEE 1-2V*L*
2 series · 2 of 2 positions shown · non-contrast
Comparison: None.

CLINICAL DATA: Pain

EXAM:
LEFT KNEE - 1-2 VIEW

[view not recorded (1 of 2)]
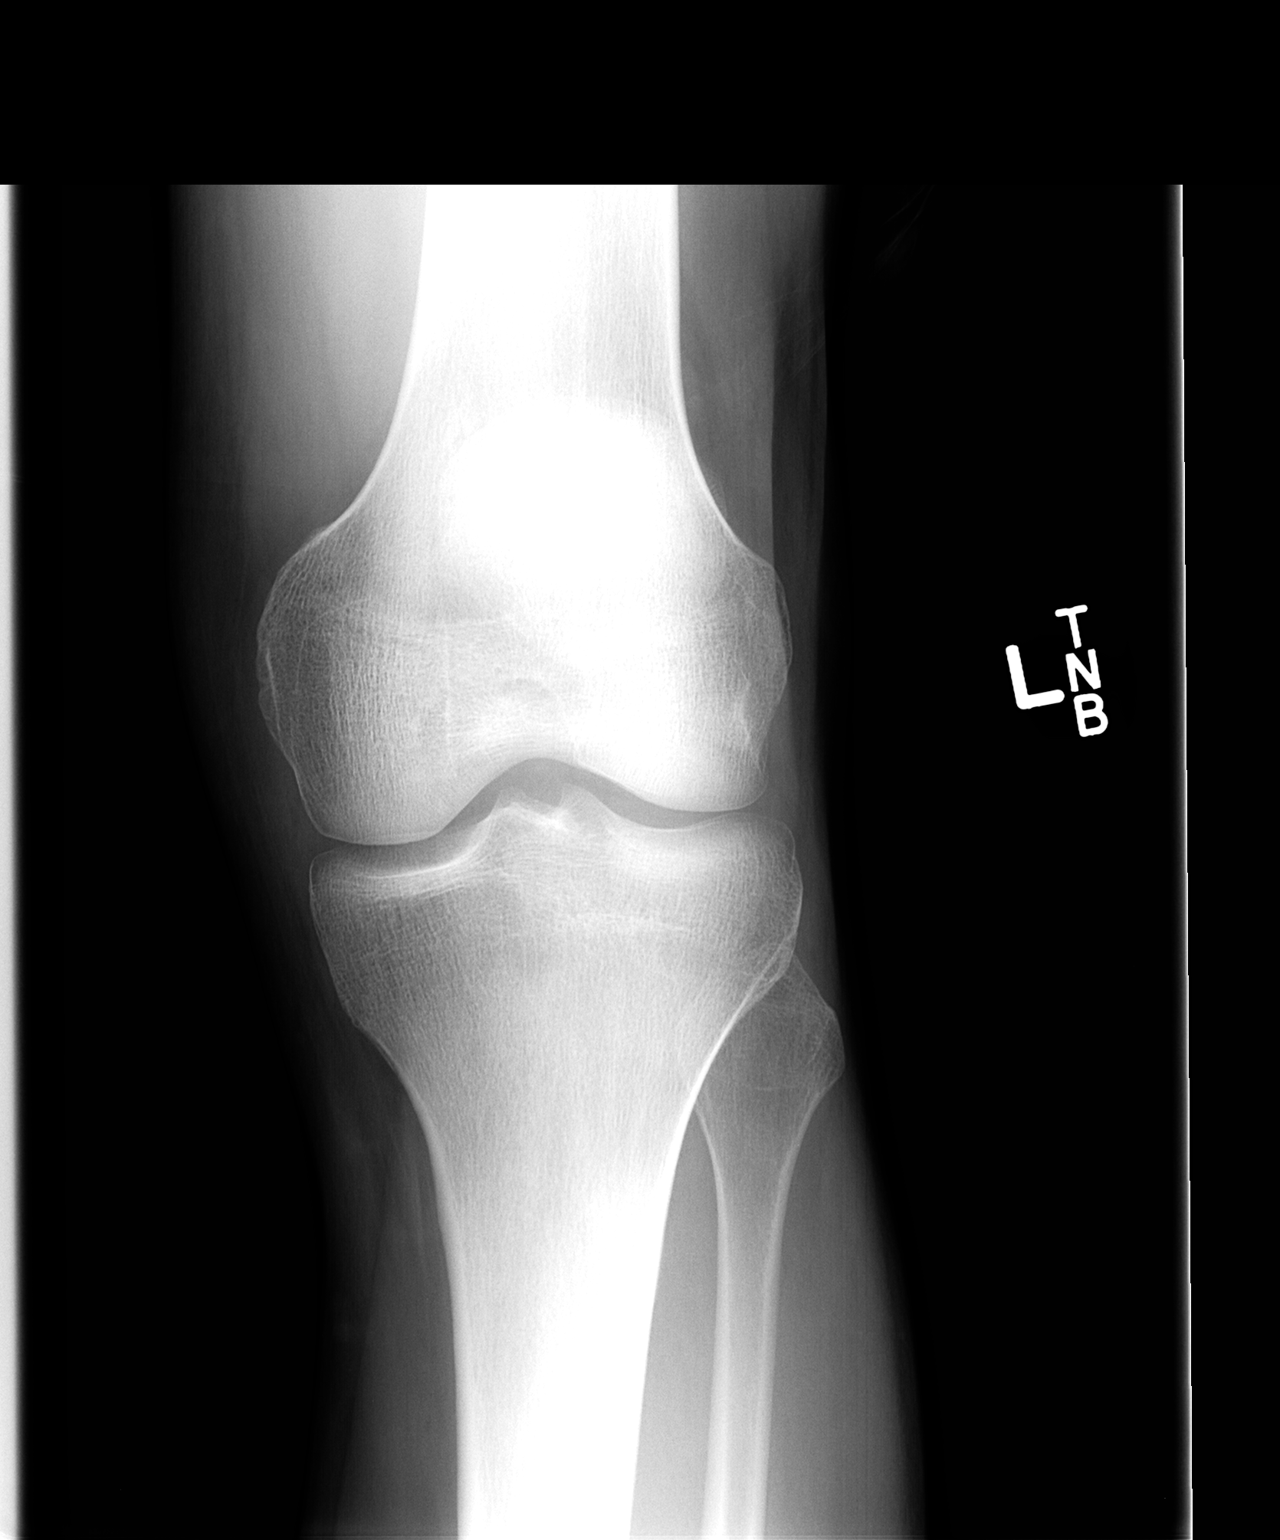

[view not recorded (2 of 2)]
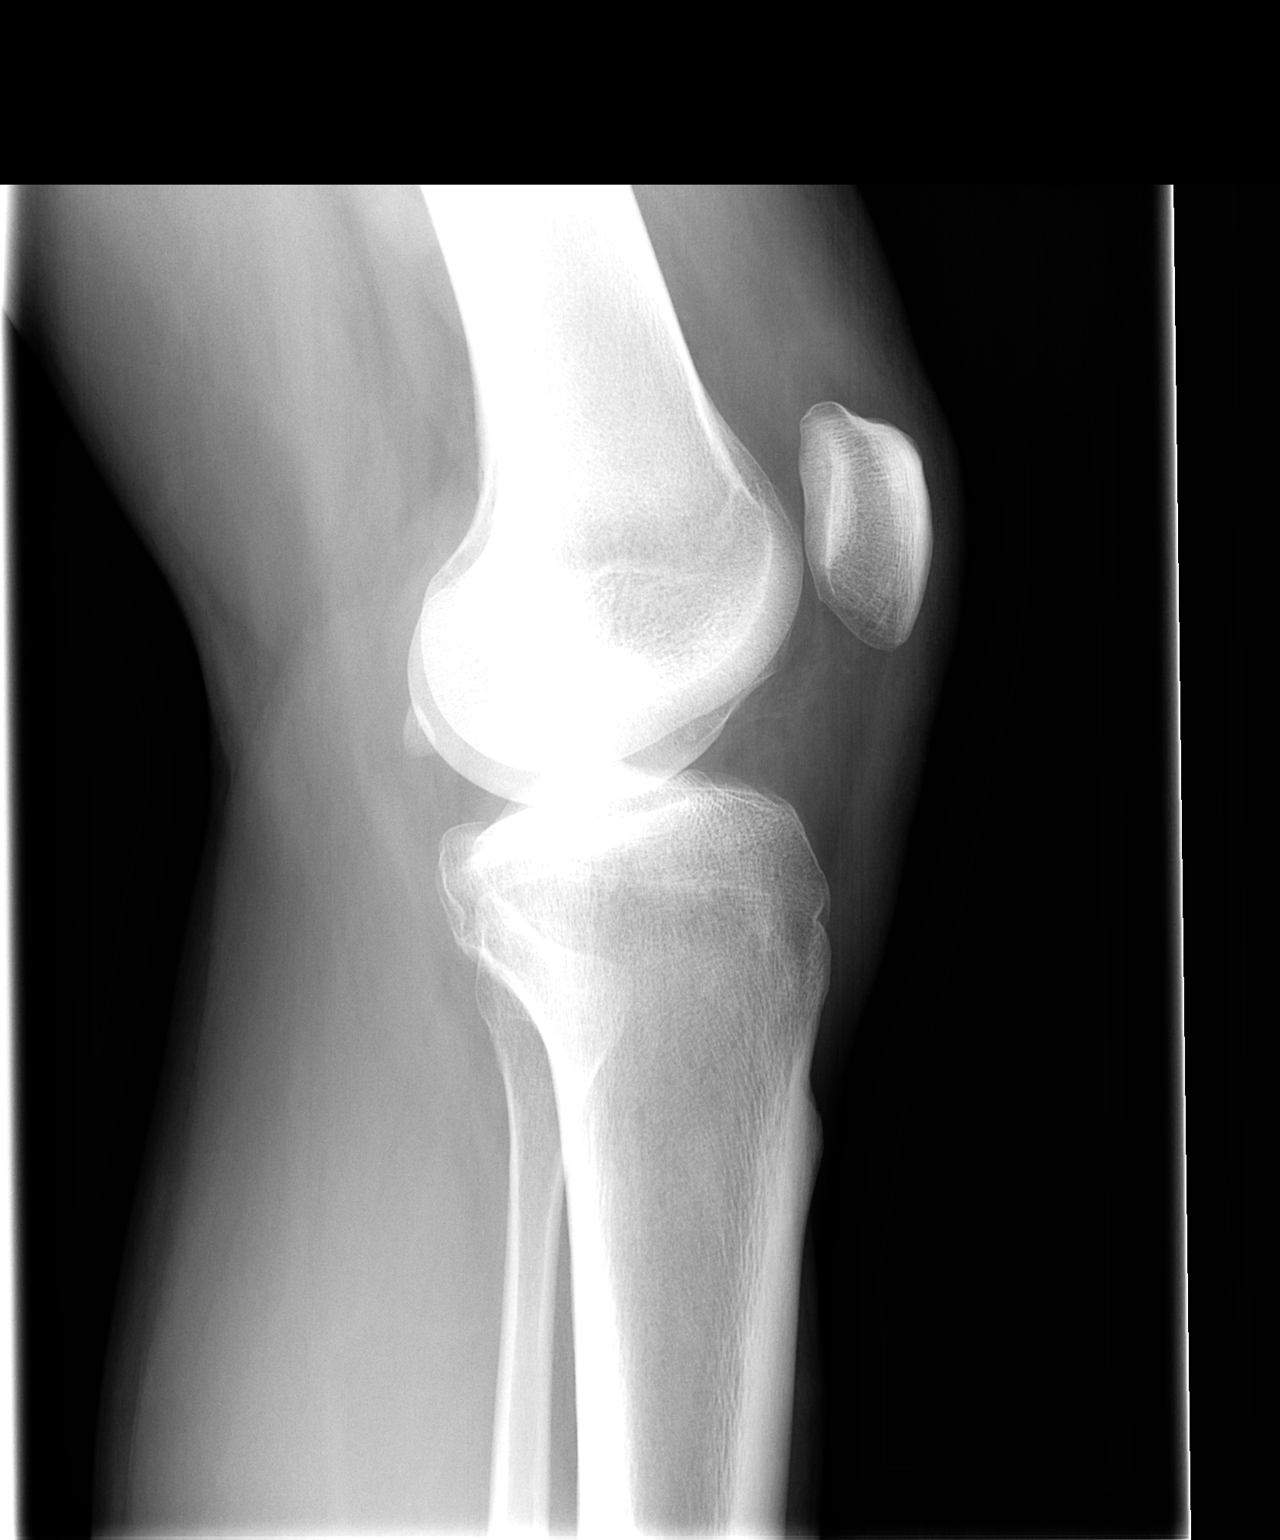

[2 of 2 positions shown; findings below may reference images not displayed]

FINDINGS: There is no evidence of fracture, dislocation, or joint effusion.
There is no evidence of arthropathy or other focal bone abnormality.
Soft tissues are unremarkable.
IMPRESSION: Negative.

## 2016-05-19 DIAGNOSIS — G894 Chronic pain syndrome: Secondary | ICD-10-CM | POA: Diagnosis not present

## 2016-05-19 DIAGNOSIS — Z6822 Body mass index (BMI) 22.0-22.9, adult: Secondary | ICD-10-CM | POA: Diagnosis not present

## 2016-11-01 DIAGNOSIS — G894 Chronic pain syndrome: Secondary | ICD-10-CM | POA: Diagnosis not present

## 2016-11-01 DIAGNOSIS — Z6821 Body mass index (BMI) 21.0-21.9, adult: Secondary | ICD-10-CM | POA: Diagnosis not present

## 2017-04-23 DIAGNOSIS — K1329 Other disturbances of oral epithelium, including tongue: Secondary | ICD-10-CM | POA: Diagnosis not present

## 2017-05-01 DIAGNOSIS — K1329 Other disturbances of oral epithelium, including tongue: Secondary | ICD-10-CM | POA: Diagnosis not present

## 2017-07-05 DIAGNOSIS — K136 Irritative hyperplasia of oral mucosa: Secondary | ICD-10-CM | POA: Diagnosis not present

## 2022-02-06 ENCOUNTER — Emergency Department (HOSPITAL_BASED_OUTPATIENT_CLINIC_OR_DEPARTMENT_OTHER)
Admission: EM | Admit: 2022-02-06 | Discharge: 2022-02-06 | Disposition: A | Discharge status: Home / Self Care | Payer: 59 | Attending: Emergency Medicine | Admitting: Emergency Medicine

## 2022-02-06 ENCOUNTER — Encounter (HOSPITAL_BASED_OUTPATIENT_CLINIC_OR_DEPARTMENT_OTHER): Payer: Self-pay

## 2022-02-06 ENCOUNTER — Emergency Department (HOSPITAL_BASED_OUTPATIENT_CLINIC_OR_DEPARTMENT_OTHER): Payer: 59

## 2022-02-06 DIAGNOSIS — B9620 Unspecified Escherichia coli [E. coli] as the cause of diseases classified elsewhere: Secondary | ICD-10-CM | POA: Insufficient documentation

## 2022-02-06 DIAGNOSIS — N3001 Acute cystitis with hematuria: Secondary | ICD-10-CM | POA: Insufficient documentation

## 2022-02-06 DIAGNOSIS — R319 Hematuria, unspecified: Secondary | ICD-10-CM | POA: Diagnosis present

## 2022-02-06 DIAGNOSIS — R310 Gross hematuria: Secondary | ICD-10-CM

## 2022-02-06 DIAGNOSIS — Z79899 Other long term (current) drug therapy: Secondary | ICD-10-CM | POA: Diagnosis not present

## 2022-02-06 LAB — URINALYSIS, ROUTINE W REFLEX MICROSCOPIC
Bilirubin Urine: NEGATIVE
Glucose, UA: NEGATIVE mg/dL
Nitrite: POSITIVE — AB
RBC / HPF: >50 RBC/hpf — ABNORMAL HIGH (ref 0–5)
Specific Gravity, Urine: 1.024 (ref 1.005–1.030)
pH: 5.5 (ref 5.0–8.0)

## 2022-02-06 LAB — COMPREHENSIVE METABOLIC PANEL
ALT: 11 U/L (ref 0–44)
AST: 11 U/L — ABNORMAL LOW (ref 15–41)
Albumin: 4.3 g/dL (ref 3.5–5.0)
Alkaline Phosphatase: 46 U/L (ref 38–126)
Anion gap: 8 (ref 5–15)
BUN: 15 mg/dL (ref 6–20)
CO2: 25 mmol/L (ref 22–32)
Calcium: 9 mg/dL (ref 8.9–10.3)
Chloride: 106 mmol/L (ref 98–111)
Creatinine, Ser: 0.98 mg/dL (ref 0.61–1.24)
GFR, Estimated: >60 mL/min (ref >60)
Glucose, Bld: 101 mg/dL — ABNORMAL HIGH (ref 70–99)
Potassium: 4.5 mmol/L (ref 3.5–5.1)
Sodium: 139 mmol/L (ref 135–145)
Total Bilirubin: 0.3 mg/dL (ref 0.3–1.2)
Total Protein: 7.1 g/dL (ref 6.5–8.1)

## 2022-02-06 LAB — CBC WITH DIFFERENTIAL/PLATELET
Abs Immature Granulocytes: 0.03 10*3/uL (ref 0.00–0.07)
Basophils Absolute: 0.1 10*3/uL (ref 0.0–0.1)
Basophils Relative: 1 %
Eosinophils Absolute: 0.1 10*3/uL (ref 0.0–0.5)
Eosinophils Relative: 1 %
HCT: 37.3 % — ABNORMAL LOW (ref 39.0–52.0)
Hemoglobin: 12.6 g/dL — ABNORMAL LOW (ref 13.0–17.0)
Immature Granulocytes: 0 %
Lymphocytes Relative: 17 %
Lymphs Abs: 1.6 10*3/uL (ref 0.7–4.0)
MCH: 30.6 pg (ref 26.0–34.0)
MCHC: 33.8 g/dL (ref 30.0–36.0)
MCV: 90.5 fL (ref 80.0–100.0)
Monocytes Absolute: 0.8 10*3/uL (ref 0.1–1.0)
Monocytes Relative: 8 %
Neutro Abs: 7.3 10*3/uL (ref 1.7–7.7)
Neutrophils Relative %: 73 %
Platelets: 259 10*3/uL (ref 150–400)
RBC: 4.12 MIL/uL — ABNORMAL LOW (ref 4.22–5.81)
RDW: 12 % (ref 11.5–15.5)
WBC: 9.9 10*3/uL (ref 4.0–10.5)
nRBC: 0 % (ref 0.0–0.2)

## 2022-02-06 LAB — LACTIC ACID, PLASMA: Lactic Acid, Venous: 0.7 mmol/L (ref 0.5–1.9)

## 2022-02-06 LAB — PROTIME-INR
INR: 1 (ref 0.8–1.2)
Prothrombin Time: 13 seconds (ref 11.4–15.2)

## 2022-02-06 MED ORDER — CEPHALEXIN 500 MG PO CAPS: 500.0000 mg | ORAL_CAPSULE | Freq: Four times a day (QID) | ORAL | 0 refills | Status: AC

## 2022-02-06 NOTE — Discharge Instructions (Signed)
Your history, exam, work-up today confirmed a urinary tract infection and I suspect this is causing the hematuria or bleeding in the urine.  The CT scan showed evidence of cystitis and did not show other kidney stones or pockets of infection.  Given your reassuring vital signs and well appearance we feel you are safe for discharge home to follow-up with outpatient urology and take antibiotics.  Please take the antibiotics and rest and stay hydrated.  If any symptoms change or worsen acutely, please turn to the nearest emergency department.

## 2022-02-06 NOTE — ED Triage Notes (Addendum)
Pt states he has been passing blood clots in urine starting yesterday. +dysuria and body aches Denies flank or abdominal pain

## 2022-02-06 NOTE — ED Notes (Signed)
Discharge paperwork given and verbally understood. 

## 2022-02-06 NOTE — ED Provider Notes (Signed)
[facility] EMERGENCY DEPT Provider Note   CSN: [id number] Arrival date & time: 02/06/22  3151     History  Chief Complaint  Patient presents with   Hematuria    Darrell [name] is a 41 y.o. male.  The history is provided by the patient and medical records. No language interpreter was used.  Hematuria This is a new problem. The current episode started 2 days ago. The problem occurs constantly. The problem has not changed since onset.Associated symptoms include abdominal pain. Pertinent negatives include no chest pain, no headaches and no shortness of breath. Nothing aggravates the symptoms. Nothing relieves the symptoms. He has tried nothing for the symptoms. The treatment provided no relief.       Home Medications Prior to Admission medications   Medication Sig Start Date End Date Taking? Authorizing Provider  b complex vitamins tablet Take 1 tablet by mouth daily.    [provider]  Cholecalciferol (VITAMIN D-3) 1000 UNITS CAPS Take by mouth.    [provider]  HYDROcodone-acetaminophen (NORCO/VICODIN) 5-325 MG per tablet Take 1 tablet by mouth every 4 (four) hours as needed. 04/19/14   Ivery Quale, PA-C  meloxicam (MOBIC) 15 MG tablet Take 1 tablet (15 mg total) by mouth daily. 04/19/14   Ivery Quale, PA-C  methocarbamol (ROBAXIN) 500 MG tablet Take 1 tablet (500 mg total) by mouth 3 (three) times daily. 04/19/14   Ivery Quale, PA-C  methylphenidate (RITALIN) 10 MG tablet Take 10 mg by mouth daily.    [provider]  varenicline (CHANTIX PAK) 0.5 MG X 11 & 1 MG X 42 tablet Take by mouth 2 (two) times daily. Take one 0.5 mg tablet by mouth once daily for 3 days, then increase to one 0.5 mg tablet twice daily for 4 days, then increase to one 1 mg tablet twice daily.    [provider]      Allergies    Patient has no known allergies.    Review of Systems   Review of Systems  Constitutional:  Positive for chills  and fatigue. Negative for fever.  HENT:  Negative for congestion.   Respiratory:  Negative for cough, chest tightness, shortness of breath and wheezing.   Cardiovascular:  Negative for chest pain.  Gastrointestinal:  Positive for abdominal pain. Negative for constipation, diarrhea, nausea and vomiting.  Genitourinary:  Positive for dysuria and hematuria. Negative for flank pain and frequency.  Musculoskeletal:  Positive for back pain. Negative for neck pain and neck stiffness.  Skin:  Negative for rash and wound.  Neurological:  Negative for weakness, light-headedness, numbness and headaches.  Psychiatric/Behavioral:  Negative for agitation and confusion.   All other systems reviewed and are negative.   Physical Exam Updated Vital Signs BP 110/76 (BP Location: Right Arm)   Pulse (!) 59   Temp 98 F (36.7 C) (Oral)   Resp 18   Ht 6\' 3"  (1.905 m)   Wt 95.3 kg   SpO2 99%   BMI 26.25 kg/m  Physical Exam Vitals and nursing note reviewed.  Constitutional:      General: He is not in acute distress.    Appearance: He is well-developed. He is not ill-appearing, toxic-appearing or diaphoretic.  HENT:     Head: Normocephalic and atraumatic.  Eyes:     Conjunctiva/sclera: Conjunctivae normal.  Cardiovascular:     Rate and Rhythm: Normal rate and regular rhythm.     Heart sounds: No murmur heard. Pulmonary:  Effort: Pulmonary effort is normal. No respiratory distress.     Breath sounds: Normal breath sounds. No wheezing, rhonchi or rales.  Chest:     Chest wall: No tenderness.  Abdominal:     General: Abdomen is flat. There is no distension.     Palpations: Abdomen is soft.     Tenderness: There is no abdominal tenderness. There is no right CVA tenderness, left CVA tenderness, guarding or rebound.  Genitourinary:    Comments: Pt denied scrotal or penile pain so GU exam initally deferred.  Musculoskeletal:        General: No swelling.     Cervical back: Neck supple. No  tenderness.  Skin:    General: Skin is warm and dry.     Capillary Refill: Capillary refill takes less than 2 seconds.     Findings: No erythema or rash.  Neurological:     General: No focal deficit present.     Mental Status: He is alert.  Psychiatric:        Mood and Affect: Mood normal.     ED Results / Procedures / Treatments   Labs (all labs ordered are listed, but only abnormal results are displayed) Labs Reviewed  URINALYSIS, ROUTINE W REFLEX MICROSCOPIC - Abnormal; Notable for the following components:      Result Value   APPearance HAZY (*)    Hgb urine dipstick MODERATE (*)    Ketones, ur TRACE (*)    Protein, ur TRACE (*)    Nitrite POSITIVE (*)    Leukocytes,Ua LARGE (*)    RBC / HPF >50 (*)    Bacteria, UA RARE (*)    All other components within normal limits  CBC WITH DIFFERENTIAL/PLATELET - Abnormal; Notable for the following components:   RBC 4.12 (*)    Hemoglobin 12.6 (*)    HCT 37.3 (*)    All other components within normal limits  COMPREHENSIVE METABOLIC PANEL - Abnormal; Notable for the following components:   Glucose, Bld 101 (*)    AST 11 (*)    All other components within normal limits  URINE CULTURE  LACTIC ACID, PLASMA  PROTIME-INR  LACTIC ACID, PLASMA    EKG None  Radiology CT Renal Stone Study  Result Date: 02/06/2022 CLINICAL DATA:  Flank pain.  Gross hematuria.  Suspect kidney stone. EXAM: CT ABDOMEN AND PELVIS WITHOUT CONTRAST TECHNIQUE: Multidetector CT imaging of the abdomen and pelvis was performed following the standard protocol without IV contrast. RADIATION DOSE REDUCTION: This exam was performed according to the departmental dose-optimization program which includes automated exposure control, adjustment of the mA and/or kV according to patient size and/or use of iterative reconstruction technique. COMPARISON:  CT of the abdomen with contrast 08/09/2005 FINDINGS: Lower chest: The lung bases are clear without focal nodule, mass, or  airspace disease. The heart size is normal. No significant pleural or pericardial effusion is present. Hepatobiliary: No focal liver abnormality is seen. No gallstones, gallbladder wall thickening, or biliary dilatation. Pancreas: Unremarkable. No pancreatic ductal dilatation or surrounding inflammatory changes. Spleen: Normal in size without focal abnormality. Adrenals/Urinary Tract: The adrenal glands are normal bilaterally. Kidneys are unremarkable. No stone or mass lesion is present. No obstruction is present. Ureters are within normal limits. Diffuse bladder wall thickening inflammatory changes noted. No discrete mass lesion is present. No intraluminal mass or blood clot evident. Stomach/Bowel: Stomach and duodenum are within limits. Small bowel is unremarkable. Terminal ileum is within normal limits. The appendix visualized and normal. Ascending  and transverse colon are within normal limits. Descending and sigmoid colon are normal. Vascular/Lymphatic: Scattered atherosclerotic calcifications are present in the aorta and branch vessels. No aneurysm is present. No significant adenopathy is present. Reproductive: Prostate is unremarkable. Other: No abdominal wall hernia or abnormality. No abdominopelvic ascites. Musculoskeletal: Vertebral body heights and alignment are normal. A broad-based disc protrusion L4-5 results in mild right foraminal stenosis. Bony pelvis is within normal limits. Hips are located and within normal limits bilaterally. IMPRESSION: 1. Diffuse bladder wall thickening and inflammatory changes compatible with acute cystitis. 2. No renal stones or obstruction. 3.  Aortic Atherosclerosis (ICD10-I70.0). Electronically Signed   By: Marin Roberts M.D.   On: 02/06/2022 13:08    Procedures Procedures    Medications Ordered in ED Medications - No data to display  ED Course/ Medical Decision Making/ A&P                           Medical Decision Making Amount and/or Complexity of  Data Reviewed Labs: ordered. Radiology: ordered.  Risk Prescription drug management.    OBDULIO MASH is a 41 y.o. male with no significant past medical history who presents with dysuria, gross hematuria, some suprapubic soreness, mild chills, and some back pain.  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  He reports it is painful when he urinates.  He denies any actual testicle or penile pain itself but reports some suprapubic soreness.  He has never had a history of UTIs and denies history of bladder tumor.  Denies history of kidney stones but he did show me a picture on his phone of some stone like things in the toilet that came in his urine.  He reports feeling fatigued and some chills and denies history of pyelonephritis.  Denies any other URI symptoms such as fevers, congestion, cough, chest pain, shortness of breath.  Otherwise denies nausea, vomiting, or trauma.  On exam, lungs clear and chest nontender.  Abdomen was nontender.  Flanks and back nontender.  Bowel sounds are appreciated.  We did have urinalysis in triage that does show evidence of nitrites, leukocytes, and bacteria with hematuria.  I do suspect he has a UTI versus pyelonephritis with his chills and back soreness however given the suspected stones in the toilet, we will get a CT stone study to rule out infected stone or other abnormality.  We will get some screening labs to look at kidney function and we will send a urine culture off as well.  If imaging is reassuring, dissipate discharge with antibiotics for UTI and urology follow-up for further evaluation of the gross hematuria for likely outpatient cystoscopy and further management.  Patient agrees with this plan.  The CT scan showed evidence of acute cystitis but no evidence of kidney stones or other acute abnormality.  Kidney  function is normal.  Other labs overall reassuring but patient does indeed have urinary tract infection.  Patient be given prescription for antibiotics and will follow-up with outpatient urology.  We discussed that there could be other causes of the hemorrhage and will need to be seen by urology.  He agreed with plan of care and follow-up instructions and patient was discharged in good condition.         Final Clinical Impression(s) / ED Diagnoses Final diagnoses:  Gross hematuria  Acute cystitis with hematuria    Rx / DC Orders ED Discharge Orders          Ordered    cephALEXin (KEFLEX) 500 MG capsule  4 times daily        02/06/22 1543            Clinical Impression: 1. Gross hematuria   2. Acute cystitis with hematuria     Disposition: Discharge  Condition: Good  I have discussed the results, Dx and Tx plan with the pt(& family if present). He/she/they expressed understanding and agree(s) with the plan. Discharge instructions discussed at great length. Strict return precautions discussed and pt &/or family have verbalized understanding of the instructions. No further questions at time of discharge.    New Prescriptions   CEPHALEXIN (KEFLEX) 500 MG CAPSULE    Take 1 capsule (500 mg total) by mouth 4 (four) times daily for 10 days.    Follow Up: [facility]        Taveon Enyeart, Gwenyth Allegra, MD 02/06/22 [phone]

## 2022-02-06 NOTE — ED Notes (Addendum)
No current pain, only pain upon urinating blood, rates it 5/10 and 0/10 when no blood. No abdominal or back pain. No urinating blood today.

## 2022-02-08 LAB — URINE CULTURE: Culture: 70000 — AB

## 2022-02-09 ENCOUNTER — Telehealth (HOSPITAL_BASED_OUTPATIENT_CLINIC_OR_DEPARTMENT_OTHER): Payer: Self-pay | Admitting: *Deleted

## 2022-02-09 NOTE — Telephone Encounter (Signed)
Post ED Visit - Positive Culture Follow-up  Culture report reviewed by antimicrobial stewardship pharmacist: [place] Team []  Elenor Quinones, Pharm.D. []  Heide Guile, Pharm.D., BCPS AQ-ID []  Parks Neptune, Pharm.D., BCPS []  Alycia Rossetti, Pharm.D., BCPS []  [place], Pharm.D., BCPS, AAHIVP []  Legrand Como, Pharm.D., BCPS, AAHIVP []  Salome Arnt, PharmD, BCPS []  Johnnette Gourd, PharmD, BCPS []  Hughes Better, PharmD, BCPS []  Leeroy Cha, PharmD []  Laqueta Linden, PharmD, BCPS []  Albertina Parr, PharmD  [place] Team []  Leodis Sias, PharmD []  Lindell Spar, PharmD []  Royetta Asal, PharmD []  Graylin Shiver, Rph []  Rema Fendt) Glennon Mac, PharmD []  Arlyn Dunning, PharmD []  Netta Cedars, PharmD []  Dia Sitter, PharmD []  Leone Haven, PharmD []  Gretta Arab, PharmD []  Theodis Shove, PharmD []  Peggyann Juba, PharmD []  Reuel Boom, PharmD   Positive urine culture Treated with Cephalexin, organism sensitive to the same and no further patient follow-up is required at this time.  Titus Dubin, PharmD  Harlon Flor Nebo 02/09/2022, 11:22 AM

## 2024-01-18 ENCOUNTER — Other Ambulatory Visit (HOSPITAL_COMMUNITY): Payer: Self-pay | Admitting: Family Medicine

## 2024-01-18 DIAGNOSIS — E782 Mixed hyperlipidemia: Secondary | ICD-10-CM

## 2024-01-24 ENCOUNTER — Ambulatory Visit (HOSPITAL_COMMUNITY)
Admission: RE | Admit: 2024-01-24 | Discharge: 2024-01-24 | Disposition: A | Discharge status: Home / Self Care | Payer: Self-pay | Source: Ambulatory Visit | Attending: Family Medicine | Admitting: Family Medicine

## 2024-01-24 DIAGNOSIS — E782 Mixed hyperlipidemia: Secondary | ICD-10-CM | POA: Insufficient documentation
